# Patient Record
Sex: Male | Born: 1940 | Race: Black or African American | Hispanic: No | Marital: Single | Smoking: Former smoker
Health system: Southern US, Community
[De-identification: ages and names within clinical notes are randomized; demographics above are authoritative.]

## PROBLEM LIST (undated history)

## (undated) DIAGNOSIS — C801 Malignant (primary) neoplasm, unspecified: Secondary | ICD-10-CM

## (undated) DIAGNOSIS — J329 Chronic sinusitis, unspecified: Secondary | ICD-10-CM

## (undated) DIAGNOSIS — K219 Gastro-esophageal reflux disease without esophagitis: Secondary | ICD-10-CM

## (undated) DIAGNOSIS — M549 Dorsalgia, unspecified: Secondary | ICD-10-CM

## (undated) DIAGNOSIS — IMO0001 Reserved for inherently not codable concepts without codable children: Secondary | ICD-10-CM

## (undated) DIAGNOSIS — F419 Anxiety disorder, unspecified: Secondary | ICD-10-CM

## (undated) DIAGNOSIS — I2510 Atherosclerotic heart disease of native coronary artery without angina pectoris: Secondary | ICD-10-CM

## (undated) DIAGNOSIS — E7800 Pure hypercholesterolemia, unspecified: Secondary | ICD-10-CM

## (undated) DIAGNOSIS — C61 Malignant neoplasm of prostate: Secondary | ICD-10-CM

## (undated) DIAGNOSIS — I10 Essential (primary) hypertension: Secondary | ICD-10-CM

## (undated) HISTORY — PX: BACK SURGERY: SHX140

## (undated) HISTORY — PX: KNEE ARTHROSCOPY: SUR90

---

## 2012-09-08 ENCOUNTER — Encounter (HOSPITAL_BASED_OUTPATIENT_CLINIC_OR_DEPARTMENT_OTHER): Payer: Self-pay | Admitting: *Deleted

## 2012-09-08 ENCOUNTER — Emergency Department (HOSPITAL_BASED_OUTPATIENT_CLINIC_OR_DEPARTMENT_OTHER): Payer: Medicare HMO

## 2012-09-08 ENCOUNTER — Emergency Department (HOSPITAL_BASED_OUTPATIENT_CLINIC_OR_DEPARTMENT_OTHER)
Admission: EM | Admit: 2012-09-08 | Discharge: 2012-09-08 | Disposition: A | Discharge status: Home / Self Care | Payer: Medicare HMO | Attending: Emergency Medicine | Admitting: Emergency Medicine

## 2012-09-08 DIAGNOSIS — M549 Dorsalgia, unspecified: Secondary | ICD-10-CM | POA: Insufficient documentation

## 2012-09-08 DIAGNOSIS — Z7982 Long term (current) use of aspirin: Secondary | ICD-10-CM | POA: Insufficient documentation

## 2012-09-08 DIAGNOSIS — J329 Chronic sinusitis, unspecified: Secondary | ICD-10-CM | POA: Insufficient documentation

## 2012-09-08 DIAGNOSIS — Z87891 Personal history of nicotine dependence: Secondary | ICD-10-CM | POA: Insufficient documentation

## 2012-09-08 DIAGNOSIS — E7800 Pure hypercholesterolemia, unspecified: Secondary | ICD-10-CM | POA: Insufficient documentation

## 2012-09-08 DIAGNOSIS — Z791 Long term (current) use of non-steroidal anti-inflammatories (NSAID): Secondary | ICD-10-CM | POA: Insufficient documentation

## 2012-09-08 DIAGNOSIS — I10 Essential (primary) hypertension: Secondary | ICD-10-CM | POA: Insufficient documentation

## 2012-09-08 DIAGNOSIS — R51 Headache: Secondary | ICD-10-CM | POA: Insufficient documentation

## 2012-09-08 DIAGNOSIS — I2510 Atherosclerotic heart disease of native coronary artery without angina pectoris: Secondary | ICD-10-CM | POA: Insufficient documentation

## 2012-09-08 DIAGNOSIS — K219 Gastro-esophageal reflux disease without esophagitis: Secondary | ICD-10-CM | POA: Insufficient documentation

## 2012-09-08 HISTORY — DX: Pure hypercholesterolemia, unspecified: E78.00

## 2012-09-08 HISTORY — DX: Chronic sinusitis, unspecified: J32.9

## 2012-09-08 HISTORY — DX: Atherosclerotic heart disease of native coronary artery without angina pectoris: I25.10

## 2012-09-08 HISTORY — DX: Essential (primary) hypertension: I10

## 2012-09-08 HISTORY — DX: Gastro-esophageal reflux disease without esophagitis: K21.9

## 2012-09-08 HISTORY — DX: Reserved for inherently not codable concepts without codable children: IMO0001

## 2012-09-08 LAB — CBC WITH DIFFERENTIAL/PLATELET
Basophils Absolute: 0 10*3/uL (ref 0.0–0.1)
Basophils Relative: 0 % (ref 0–1)
Eosinophils Absolute: 0 10*3/uL (ref 0.0–0.7)
Hemoglobin: 12.7 g/dL — ABNORMAL LOW (ref 13.0–17.0)
MCH: 29.5 pg (ref 26.0–34.0)
MCHC: 33.4 g/dL (ref 30.0–36.0)
Monocytes Absolute: 0.3 10*3/uL (ref 0.1–1.0)
Monocytes Relative: 7 % (ref 3–12)
Neutrophils Relative %: 75 % (ref 43–77)
RDW: 12.8 % (ref 11.5–15.5)

## 2012-09-08 LAB — URINALYSIS, ROUTINE W REFLEX MICROSCOPIC
Glucose, UA: NEGATIVE mg/dL
Leukocytes, UA: NEGATIVE
Specific Gravity, Urine: 1.02 (ref 1.005–1.030)
pH: 7 (ref 5.0–8.0)

## 2012-09-08 LAB — COMPREHENSIVE METABOLIC PANEL
Albumin: 3.8 g/dL (ref 3.5–5.2)
BUN: 17 mg/dL (ref 6–23)
Creatinine, Ser: 1 mg/dL (ref 0.50–1.35)
Total Protein: 7.2 g/dL (ref 6.0–8.3)

## 2012-09-08 MED ORDER — PREDNISONE 10 MG PO TABS: 20.0000 mg | ORAL_TABLET | Freq: Two times a day (BID) | ORAL | Status: AC

## 2012-09-08 MED ORDER — HYDROCODONE-ACETAMINOPHEN 5-325 MG PO TABS: 2.0000 | ORAL_TABLET | ORAL | Status: AC | PRN

## 2012-09-08 MED ORDER — HYDROCODONE-ACETAMINOPHEN 5-325 MG PO TABS
2.0000 | ORAL_TABLET | Freq: Once | ORAL | Status: AC
  Administered 2012-09-08: 2 via ORAL
  Filled 2012-09-08: qty 2

## 2012-09-08 NOTE — ED Notes (Signed)
Patient states he has a multiple year history of pain in his left groin, radiating down the left leg to the foot, and into his left back up his spine into his head.  States this morning the pain intensified causing problems with dressing.  Pt is s/p lumbar spine surgery with pain problems since surgery.  This morning, the pain in his groin is worse.

## 2012-09-08 NOTE — ED Provider Notes (Signed)
History     CSN: [id number]  Arrival date & time 09/08/12  6295   First MD Initiated Contact with Patient 09/08/12 [phone]      Chief Complaint  Patient presents with  . Muscle Pain    (Consider location/radiation/quality/duration/timing/severity/associated sxs/prior treatment) HPI Comments: Patient presents with multiple complaints.  Patient has pcp in [place].  He recently moved here to live with daughter and son-in-law.  He has a history of chronic low back pain since having surgery 40 years ago.  This suddenly became worse yesterday with radiation to the left groin and neck.  It is worse when he moves or stands.  No weakness or numbness in the feet or legs.  No bowel or bladder complaints.    He is also complaining of "fogginess" in his head, occasionally seems confused.  This has been going on for some time now.  Daughter had him seen by pcp yesterday and passed a mental status exam.  She also reports that he had a colonoscopy years ago and had polyps removed.  He was to have more testing but the patient declined to have any more done.    The history is provided by the patient.    Past Medical History  Diagnosis Date  . Hypertension   . Sinusitis, chronic   . Coronary artery disease   . Reflux   . Elevated cholesterol     Past Surgical History  Procedure Laterality Date  . Back surgery    . Knee arthroscopy Left     No family history on file.  History  Substance Use Topics  . Smoking status: Former Games developer  . Smokeless tobacco: Not on file  . Alcohol Use: No      Review of Systems  All other systems reviewed and are negative.    Allergies  Review of patient's allergies indicates no known allergies.  Home Medications   Current Outpatient Rx  Name  Route  Sig  Dispense  Refill  . ALPRAZolam (XANAX) 0.5 MG tablet   Oral   Take 0.5 mg by mouth at bedtime as needed for sleep.         Marland Kitchen amLODipine (NORVASC) 10 MG tablet   Oral   Take 10 mg by mouth  daily.         Marland Kitchen aspirin 81 MG tablet   Oral   Take 81 mg by mouth daily.         . carvedilol (COREG) 25 MG tablet   Oral   Take by mouth 2 (two) times daily with a meal.         . cloNIDine (CATAPRES) 0.3 MG tablet   Oral   Take 0.3 mg by mouth 2 (two) times daily.         . ferrous gluconate (FERGON) 324 MG tablet   Oral   Take 324 mg by mouth daily with breakfast.         . fexofenadine (ALLEGRA) 180 MG tablet   Oral   Take 180 mg by mouth daily.         . folic acid (FOLVITE) 1 MG tablet   Oral   Take by mouth daily.         . furosemide (LASIX) 40 MG tablet   Oral   Take 40 mg by mouth daily.         . hydrALAZINE (APRESOLINE) 100 MG tablet   Oral   Take 100 mg by mouth 2 (two) times daily.         Marland Kitchen  isosorbide dinitrate (ISORDIL) 30 MG tablet   Oral   Take 30 mg by mouth 4 (four) times daily.         Marland Kitchen omeprazole (PRILOSEC) 20 MG capsule   Oral   Take 20 mg by mouth daily.         . potassium chloride (K-DUR) 10 MEQ tablet   Oral   Take 10 mEq by mouth 2 (two) times daily.         . rosuvastatin (CRESTOR) 10 MG tablet   Oral   Take 10 mg by mouth daily.         . Vitamin D, Ergocalciferol, (DRISDOL) 50000 UNITS CAPS   Oral   Take 50,000 Units by mouth.           BP 199/88  Pulse 122  Temp(Src) 98.1 F (36.7 C) (Oral)  Resp 20  Ht 5' 10.5" (1.791 m)  Wt 224 lb (101.606 kg)  BMI 31.68 kg/m2  SpO2 96%  Physical Exam  Nursing note and vitals reviewed. Constitutional: He is oriented to person, place, and time. He appears well-developed and well-nourished. No distress.  HENT:  Head: Normocephalic and atraumatic.  Mouth/Throat: Oropharynx is clear and moist. No oropharyngeal exudate.  Eyes: EOM are normal. Pupils are equal, round, and reactive to light.  Neck: Normal range of motion. Neck supple.  Cardiovascular: Normal rate, regular rhythm and normal heart sounds.   No murmur heard. Pulmonary/Chest: Effort normal  and breath sounds normal. No respiratory distress. He has no wheezes.  Abdominal: Soft. Bowel sounds are normal. He exhibits no distension. There is no tenderness.  Musculoskeletal: Normal range of motion.  The ble are noted to have findings consistent with venous stasis, however DP and PT pulses are easily palpable.    Lymphadenopathy:    He has no cervical adenopathy.  Neurological: He is alert and oriented to person, place, and time. No cranial nerve deficit. He exhibits normal muscle tone. Coordination normal.  DTR's are trace and equal in the ble.  Strength is 5/5 in the ble.    Skin: Skin is warm and dry. He is not diaphoretic.    ED Course  Procedures (including critical care time)  Labs Reviewed - No data to display No results found.   No diagnosis found.    MDM  The workup reveals unremarkable labs, urine, and xrays.  He was also complaining of his head hurting and feeling hollow.  The ct was unremarkable.  I will prescribe prednisone and pain medication for his back pain that appears radicular in nature.          Geoffery Lyons, MD 09/08/12 [phone]

## 2014-05-18 ENCOUNTER — Encounter (HOSPITAL_BASED_OUTPATIENT_CLINIC_OR_DEPARTMENT_OTHER): Payer: Self-pay | Admitting: *Deleted

## 2014-05-18 ENCOUNTER — Emergency Department (HOSPITAL_BASED_OUTPATIENT_CLINIC_OR_DEPARTMENT_OTHER): Payer: Medicare HMO

## 2014-05-18 ENCOUNTER — Emergency Department (HOSPITAL_BASED_OUTPATIENT_CLINIC_OR_DEPARTMENT_OTHER)
Admission: EM | Admit: 2014-05-18 | Discharge: 2014-05-18 | Disposition: A | Discharge status: Home / Self Care | Payer: Medicare HMO | Attending: Emergency Medicine | Admitting: Emergency Medicine

## 2014-05-18 DIAGNOSIS — K219 Gastro-esophageal reflux disease without esophagitis: Secondary | ICD-10-CM | POA: Diagnosis not present

## 2014-05-18 DIAGNOSIS — S39012A Strain of muscle, fascia and tendon of lower back, initial encounter: Secondary | ICD-10-CM | POA: Insufficient documentation

## 2014-05-18 DIAGNOSIS — R109 Unspecified abdominal pain: Secondary | ICD-10-CM | POA: Insufficient documentation

## 2014-05-18 DIAGNOSIS — I10 Essential (primary) hypertension: Secondary | ICD-10-CM | POA: Insufficient documentation

## 2014-05-18 DIAGNOSIS — E780 Pure hypercholesterolemia: Secondary | ICD-10-CM | POA: Diagnosis not present

## 2014-05-18 DIAGNOSIS — I2510 Atherosclerotic heart disease of native coronary artery without angina pectoris: Secondary | ICD-10-CM | POA: Insufficient documentation

## 2014-05-18 DIAGNOSIS — Z7982 Long term (current) use of aspirin: Secondary | ICD-10-CM | POA: Diagnosis not present

## 2014-05-18 DIAGNOSIS — Y9289 Other specified places as the place of occurrence of the external cause: Secondary | ICD-10-CM | POA: Diagnosis not present

## 2014-05-18 DIAGNOSIS — M5416 Radiculopathy, lumbar region: Secondary | ICD-10-CM | POA: Insufficient documentation

## 2014-05-18 DIAGNOSIS — F419 Anxiety disorder, unspecified: Secondary | ICD-10-CM | POA: Diagnosis not present

## 2014-05-18 DIAGNOSIS — Y9389 Activity, other specified: Secondary | ICD-10-CM | POA: Insufficient documentation

## 2014-05-18 DIAGNOSIS — Y998 Other external cause status: Secondary | ICD-10-CM | POA: Insufficient documentation

## 2014-05-18 DIAGNOSIS — Z87891 Personal history of nicotine dependence: Secondary | ICD-10-CM | POA: Diagnosis not present

## 2014-05-18 DIAGNOSIS — Z79899 Other long term (current) drug therapy: Secondary | ICD-10-CM | POA: Diagnosis not present

## 2014-05-18 DIAGNOSIS — M5410 Radiculopathy, site unspecified: Secondary | ICD-10-CM

## 2014-05-18 DIAGNOSIS — X58XXXA Exposure to other specified factors, initial encounter: Secondary | ICD-10-CM | POA: Insufficient documentation

## 2014-05-18 DIAGNOSIS — M545 Low back pain: Secondary | ICD-10-CM | POA: Diagnosis present

## 2014-05-18 HISTORY — DX: Anxiety disorder, unspecified: F41.9

## 2014-05-18 LAB — URINALYSIS, ROUTINE W REFLEX MICROSCOPIC
BILIRUBIN URINE: NEGATIVE
GLUCOSE, UA: NEGATIVE mg/dL
HGB URINE DIPSTICK: NEGATIVE
Ketones, ur: NEGATIVE mg/dL
LEUKOCYTES UA: NEGATIVE
Nitrite: NEGATIVE
PH: 7 (ref 5.0–8.0)
PROTEIN: NEGATIVE mg/dL
SPECIFIC GRAVITY, URINE: 1.008 (ref 1.005–1.030)
UROBILINOGEN UA: 1 mg/dL (ref 0.0–1.0)

## 2014-05-18 LAB — CBC WITH DIFFERENTIAL/PLATELET
BASOS PCT: 0 % (ref 0–1)
Basophils Absolute: 0 10*3/uL (ref 0.0–0.1)
Eosinophils Absolute: 0 10*3/uL (ref 0.0–0.7)
Eosinophils Relative: 1 % (ref 0–5)
HCT: 39.4 % (ref 39.0–52.0)
HEMOGLOBIN: 13 g/dL (ref 13.0–17.0)
Lymphocytes Relative: 21 % (ref 12–46)
Lymphs Abs: 1.1 10*3/uL (ref 0.7–4.0)
MCH: 29 pg (ref 26.0–34.0)
MCHC: 33 g/dL (ref 30.0–36.0)
MCV: 87.9 fL (ref 78.0–100.0)
MONOS PCT: 12 % (ref 3–12)
Monocytes Absolute: 0.6 10*3/uL (ref 0.1–1.0)
NEUTROS ABS: 3.5 10*3/uL (ref 1.7–7.7)
Neutrophils Relative %: 66 % (ref 43–77)
PLATELETS: 191 10*3/uL (ref 150–400)
RBC: 4.48 MIL/uL (ref 4.22–5.81)
RDW: 12.9 % (ref 11.5–15.5)
WBC: 5.2 10*3/uL (ref 4.0–10.5)

## 2014-05-18 LAB — COMPREHENSIVE METABOLIC PANEL
ALBUMIN: 4.3 g/dL (ref 3.5–5.2)
ALT: 22 U/L (ref 0–53)
ANION GAP: 5 (ref 5–15)
AST: 21 U/L (ref 0–37)
Alkaline Phosphatase: 46 U/L (ref 39–117)
BILIRUBIN TOTAL: 1 mg/dL (ref 0.3–1.2)
BUN: 22 mg/dL (ref 6–23)
CALCIUM: 9 mg/dL (ref 8.4–10.5)
CHLORIDE: 108 mmol/L (ref 96–112)
CO2: 25 mmol/L (ref 19–32)
Creatinine, Ser: 1.12 mg/dL (ref 0.50–1.35)
GFR, EST AFRICAN AMERICAN: 73 mL/min — AB (ref >90)
GFR, EST NON AFRICAN AMERICAN: 63 mL/min — AB (ref >90)
GLUCOSE: 121 mg/dL — AB (ref 70–99)
POTASSIUM: 3.8 mmol/L (ref 3.5–5.1)
Sodium: 138 mmol/L (ref 135–145)
TOTAL PROTEIN: 8.2 g/dL (ref 6.0–8.3)

## 2014-05-18 MED ORDER — CYCLOBENZAPRINE HCL 5 MG PO TABS: 5.0000 mg | ORAL_TABLET | Freq: Two times a day (BID) | ORAL | Status: AC | PRN

## 2014-05-18 MED ORDER — HYDROMORPHONE HCL 1 MG/ML IJ SOLN
1.0000 mg | Freq: Once | INTRAMUSCULAR | Status: AC
  Administered 2014-05-18: 1 mg via INTRAVENOUS
  Filled 2014-05-18: qty 1

## 2014-05-18 MED ORDER — IOHEXOL 300 MG/ML  SOLN
100.0000 mL | Freq: Once | INTRAMUSCULAR | Status: AC | PRN
  Administered 2014-05-18: 100 mL via INTRAVENOUS

## 2014-05-18 MED ORDER — IOHEXOL 300 MG/ML  SOLN
25.0000 mL | Freq: Once | INTRAMUSCULAR | Status: AC | PRN
  Administered 2014-05-18: 25 mL via ORAL

## 2014-05-18 MED ORDER — KETOROLAC TROMETHAMINE 30 MG/ML IJ SOLN
30.0000 mg | Freq: Once | INTRAMUSCULAR | Status: AC
  Administered 2014-05-18: 30 mg via INTRAVENOUS
  Filled 2014-05-18: qty 1

## 2014-05-18 NOTE — ED Provider Notes (Signed)
CSN: [id number]     Arrival date & time 05/18/14  0223 History   First MD Initiated Contact with Patient 05/18/14 [phone]     Chief Complaint  Patient presents with  . Back Pain     (Consider location/radiation/quality/duration/timing/severity/associated sxs/prior Treatment) Patient is a 74 y.o. male presenting with back pain.  Back Pain Location:  Lumbar spine (cervical spine) Quality:  Aching Radiates to:  Does not radiate Pain severity:  Moderate Onset quality:  Gradual Duration:  1 day Timing:  Constant Progression:  Worsening Chronicity:  Recurrent Context: not falling, not MVA and not recent injury   Context comment:  Prior discectomy 20 years prior Relieved by:  Nothing Worsened by:  Nothing tried Ineffective treatments:  None tried Associated symptoms: no abdominal pain, no fever and no numbness     Past Medical History  Diagnosis Date  . Hypertension   . Sinusitis, chronic   . Coronary artery disease   . Reflux   . Elevated cholesterol   . Anxiety    Past Surgical History  Procedure Laterality Date  . Back surgery    . Knee arthroscopy Left    No family history on file. History  Substance Use Topics  . Smoking status: Former Research scientist (life sciences)  . Smokeless tobacco: Not on file  . Alcohol Use: No    Review of Systems  Constitutional: Negative for fever.  Gastrointestinal: Negative for abdominal pain.  Musculoskeletal: Positive for back pain.  Neurological: Negative for numbness.  All other systems reviewed and are negative.     Allergies  Review of patient's allergies indicates no known allergies.  Home Medications   Prior to Admission medications   Medication Sig Start Date End Date Taking? Authorizing Provider  ALPRAZolam Duanne Moron) 0.5 MG tablet Take 0.5 mg by mouth at bedtime as needed for sleep.   Yes Historical Provider, MD  amLODipine (NORVASC) 10 MG tablet Take 10 mg by mouth daily.   Yes Historical Provider, MD  carvedilol (COREG) 25 MG tablet Take  by mouth 2 (two) times daily with a meal.   Yes Historical Provider, MD  cloNIDine (CATAPRES) 0.3 MG tablet Take 0.3 mg by mouth 2 (two) times daily.   Yes Historical Provider, MD  furosemide (LASIX) 40 MG tablet Take 40 mg by mouth daily.   Yes Historical Provider, MD  hydrALAZINE (APRESOLINE) 100 MG tablet Take 100 mg by mouth 2 (two) times daily.   Yes Historical Provider, MD  isosorbide dinitrate (ISORDIL) 30 MG tablet Take 30 mg by mouth 4 (four) times daily.   Yes Historical Provider, MD  omeprazole (PRILOSEC) 20 MG capsule Take 20 mg by mouth daily.   Yes Historical Provider, MD  potassium chloride (K-DUR) 10 MEQ tablet Take 10 mEq by mouth 2 (two) times daily.   Yes Historical Provider, MD  rosuvastatin (CRESTOR) 10 MG tablet Take 10 mg by mouth daily.   Yes Historical Provider, MD  aspirin 81 MG tablet Take 81 mg by mouth daily.    Historical Provider, MD  cyclobenzaprine (FLEXERIL) 5 MG tablet Take 1 tablet (5 mg total) by mouth 2 (two) times daily as needed for muscle spasms. 05/18/14   Debby Freiberg, MD  ferrous gluconate (FERGON) 324 MG tablet Take 324 mg by mouth daily with breakfast.    Historical Provider, MD  fexofenadine (ALLEGRA) 180 MG tablet Take 180 mg by mouth daily.    Historical Provider, MD  folic acid (FOLVITE) 1 MG tablet Take by mouth daily.  Historical Provider, MD  HYDROcodone-acetaminophen (NORCO) 5-325 MG per tablet Take 2 tablets by mouth every 4 (four) hours as needed for pain. 09/08/12   Veryl Speak, MD  predniSONE (DELTASONE) 10 MG tablet Take 2 tablets (20 mg total) by mouth 2 (two) times daily. 09/08/12   Veryl Speak, MD  Vitamin D, Ergocalciferol, (DRISDOL) 50000 UNITS CAPS Take 50,000 Units by mouth.    Historical Provider, MD   BP 158/87 mmHg  Pulse 68  Temp(Src) 98 F (36.7 C) (Oral)  Resp 18  Ht 5\' 10"  (1.778 m)  Wt 219 lb (99.338 kg)  BMI 31.42 kg/m2  SpO2 98% Physical Exam  Constitutional: He is oriented to person, place, and time. He appears  well-developed and well-nourished.  HENT:  Head: Normocephalic and atraumatic.  Eyes: Conjunctivae and EOM are normal.  Neck: Normal range of motion. Neck supple.  Cardiovascular: Normal rate, regular rhythm and normal heart sounds.   Pulmonary/Chest: Effort normal and breath sounds normal. No respiratory distress.  Abdominal: He exhibits no distension. There is no tenderness. There is no rebound and no guarding.  Musculoskeletal: Normal range of motion.       Cervical back: He exhibits tenderness. He exhibits no bony tenderness.       Thoracic back: Normal.       Lumbar back: He exhibits tenderness and bony tenderness.  Straight leg raise positive  Neurological: He is alert and oriented to person, place, and time. He has normal strength. No sensory deficit. Gait normal.  Skin: Skin is warm and dry.  Vitals reviewed.   ED Course  Procedures (including critical care time) Labs Review Labs Reviewed  COMPREHENSIVE METABOLIC PANEL - Abnormal; Notable for the following:    Glucose, Bld 121 (*)    GFR calc non Af Amer 63 (*)    GFR calc Af Amer 73 (*)    All other components within normal limits  URINE CULTURE  URINALYSIS, ROUTINE W REFLEX MICROSCOPIC  CBC WITH DIFFERENTIAL/PLATELET    Imaging Review Ct Abdomen Pelvis W Contrast  05/18/2014   CLINICAL DATA:  Left flank pain.  Low back and left leg pain.  EXAM: CT ABDOMEN AND PELVIS WITH CONTRAST  TECHNIQUE: Multidetector CT imaging of the abdomen and pelvis was performed using the standard protocol following bolus administration of intravenous contrast.  CONTRAST:  31mL OMNIPAQUE IOHEXOL 300 MG/ML SOLN, 142mL OMNIPAQUE IOHEXOL 300 MG/ML SOLN  COMPARISON:  12/05/2010  FINDINGS: The included lung bases are clear.  There is a small hiatal hernia.  There are no focal hepatic lesions. The gallbladder is physiologically distended. The spleen, pancreas, and adrenal glands are normal. Kidneys demonstrate symmetric enhancement and excretion. There  is no hydronephrosis or perinephric stranding. There are no dilated or thickened bowel loops. Mild diverticulosis of the descending colon without diverticulitis. The appendix is tentatively identified and normal.  The abdominal aorta is normal in caliber with moderate atherosclerosis. No aneurysmal dilatation. No enlarged retroperitoneal lymph nodes. No free air, free fluid, or intra-abdominal fluid collection.  Bladder is physiologically distended. Prostate gland appears mildly enlarged. No pathologic pelvic adenopathy. No pelvic free fluid.  Advanced multilevel degenerative change in the lumbar spine with degenerative disc disease and facet arthropathy. Probable postsurgical change at L4-L5. Advanced left greater than right hip degenerative change. No lytic or blastic osseous lesions.  IMPRESSION: 1. No acute abnormality in the abdomen/pelvis. 2. Incidental findings of diverticulosis, no diverticulitis. Mildly enlarged prostate gland. 3. Advanced multilevel degenerative change throughout the lumbar spine. Advanced  left greater than right hip degenerative change.   Electronically Signed   By: Jeb Levering M.D.   On: 05/18/2014 06:24     EKG Interpretation None      MDM   Final diagnoses:  Left flank pain  Lumbar strain, initial encounter  Radicular pain    74 y.o. male with pertinent PMH of Prior discectomy, chronic back pain, HTN, CAD presents with recurrent acute on chronic back pain.  No historical elements of cauda equina.  No abd tenderness.  Pain exacerbated by movement, palpation.  No urinary symptoms.  Wu as above unremarkable for acute pathology.  UA negative.  CT scan with normal aorta, but with degenerative disc dz.  Likely lumbar strain, sciatica.  DC home with standard return precautions.   I have reviewed all laboratory and imaging studies if ordered as above  1. Lumbar strain, initial encounter   2. Left flank pain   3. Radicular pain         Debby Freiberg,  MD 05/18/14 [phone]

## 2014-05-18 NOTE — ED Notes (Signed)
C/o back, neck, left leg and rt side pain onset Sunday  Denies inj

## 2014-05-18 NOTE — ED Notes (Signed)
C/o middle back , left leg, neck and rt side pain onset Sunday  Denies inj

## 2014-05-18 NOTE — Discharge Instructions (Signed)
Back Pain, Adult Low back pain is very common. About 1 in 5 people have back pain.The cause of low back pain is rarely dangerous. The pain often gets better over time.About half of people with a sudden onset of back pain feel better in just 2 weeks. About 8 in 10 people feel better by 6 weeks.  CAUSES Some common causes of back pain include:  Strain of the muscles or ligaments supporting the spine.  Wear and tear (degeneration) of the spinal discs.  Arthritis.  Direct injury to the back. DIAGNOSIS Most of the time, the direct cause of low back pain is not known.However, back pain can be treated effectively even when the exact cause of the pain is unknown.Answering your caregiver's questions about your overall health and symptoms is one of the most accurate ways to make sure the cause of your pain is not dangerous. If your caregiver needs more information, he or she may order lab work or imaging tests (X-rays or MRIs).However, even if imaging tests show changes in your back, this usually does not require surgery. HOME CARE INSTRUCTIONS For many people, back pain returns.Since low back pain is rarely dangerous, it is often a condition that people can learn to manageon their own.   Remain active. It is stressful on the back to sit or stand in one place. Do not sit, drive, or stand in one place for more than 30 minutes at a time. Take short walks on level surfaces as soon as pain allows.Try to increase the length of time you walk each day.  Do not stay in bed.Resting more than 1 or 2 days can delay your recovery.  Do not avoid exercise or work.Your body is made to move.It is not dangerous to be active, even though your back may hurt.Your back will likely heal faster if you return to being active before your pain is gone.  Pay attention to your body when you bend and lift. Many people have less discomfortwhen lifting if they bend their knees, keep the load close to their bodies,and  avoid twisting. Often, the most comfortable positions are those that put less stress on your recovering back.  Find a comfortable position to sleep. Use a firm mattress and lie on your side with your knees slightly bent. If you lie on your back, put a pillow under your knees.  Only take over-the-counter or prescription medicines as directed by your caregiver. Over-the-counter medicines to reduce pain and inflammation are often the most helpful.Your caregiver may prescribe muscle relaxant drugs.These medicines help dull your pain so you can more quickly return to your normal activities and healthy exercise.  Put ice on the injured area.  Put ice in a plastic bag.  Place a towel between your skin and the bag.  Leave the ice on for 15-20 minutes, 03-04 times a day for the first 2 to 3 days. After that, ice and heat may be alternated to reduce pain and spasms.  Ask your caregiver about trying back exercises and gentle massage. This may be of some benefit.  Avoid feeling anxious or stressed.Stress increases muscle tension and can worsen back pain.It is important to recognize when you are anxious or stressed and learn ways to manage it.Exercise is a great option. SEEK MEDICAL CARE IF:  You have pain that is not relieved with rest or medicine.  You have pain that does not improve in 1 week.  You have new symptoms.  You are generally not feeling well. SEEK   IMMEDIATE MEDICAL CARE IF:   You have pain that radiates from your back into your legs.  You develop new bowel or bladder control problems.  You have unusual weakness or numbness in your arms or legs.  You develop nausea or vomiting.  You develop abdominal pain.  You feel faint. Document Released: 04/02/2005 Document Revised: 10/02/2011 Document Reviewed: 08/04/2013 Exit[employer] Patient Information 2015 [employer]. This information is not intended to replace advice given to you by your health care provider. Make sure you  discuss any questions you have with your health care provider.  

## 2014-05-19 LAB — URINE CULTURE
COLONY COUNT: NO GROWTH
Culture: NO GROWTH

## 2015-09-30 ENCOUNTER — Emergency Department (HOSPITAL_BASED_OUTPATIENT_CLINIC_OR_DEPARTMENT_OTHER)
Admission: EM | Admit: 2015-09-30 | Discharge: 2015-10-01 | Disposition: A | Discharge status: Home / Self Care | Payer: Medicare HMO | Attending: Emergency Medicine | Admitting: Emergency Medicine

## 2015-09-30 ENCOUNTER — Encounter (HOSPITAL_BASED_OUTPATIENT_CLINIC_OR_DEPARTMENT_OTHER): Payer: Self-pay | Admitting: *Deleted

## 2015-09-30 ENCOUNTER — Emergency Department (HOSPITAL_BASED_OUTPATIENT_CLINIC_OR_DEPARTMENT_OTHER): Payer: Medicare HMO

## 2015-09-30 DIAGNOSIS — I10 Essential (primary) hypertension: Secondary | ICD-10-CM | POA: Insufficient documentation

## 2015-09-30 DIAGNOSIS — Z7982 Long term (current) use of aspirin: Secondary | ICD-10-CM | POA: Insufficient documentation

## 2015-09-30 DIAGNOSIS — Z859 Personal history of malignant neoplasm, unspecified: Secondary | ICD-10-CM | POA: Insufficient documentation

## 2015-09-30 DIAGNOSIS — M79605 Pain in left leg: Secondary | ICD-10-CM

## 2015-09-30 DIAGNOSIS — Z79899 Other long term (current) drug therapy: Secondary | ICD-10-CM | POA: Insufficient documentation

## 2015-09-30 DIAGNOSIS — M79662 Pain in left lower leg: Secondary | ICD-10-CM | POA: Insufficient documentation

## 2015-09-30 DIAGNOSIS — I2510 Atherosclerotic heart disease of native coronary artery without angina pectoris: Secondary | ICD-10-CM | POA: Insufficient documentation

## 2015-09-30 DIAGNOSIS — Z8546 Personal history of malignant neoplasm of prostate: Secondary | ICD-10-CM | POA: Diagnosis not present

## 2015-09-30 DIAGNOSIS — Z87891 Personal history of nicotine dependence: Secondary | ICD-10-CM | POA: Diagnosis not present

## 2015-09-30 HISTORY — DX: Malignant neoplasm of prostate: C61

## 2015-09-30 HISTORY — DX: Malignant (primary) neoplasm, unspecified: C80.1

## 2015-09-30 LAB — CBC
HEMATOCRIT: 33.9 % — AB (ref 39.0–52.0)
Hemoglobin: 11.4 g/dL — ABNORMAL LOW (ref 13.0–17.0)
MCH: 28.7 pg (ref 26.0–34.0)
MCHC: 33.6 g/dL (ref 30.0–36.0)
MCV: 85.4 fL (ref 78.0–100.0)
PLATELETS: 175 10*3/uL (ref 150–400)
RBC: 3.97 MIL/uL — AB (ref 4.22–5.81)
RDW: 12.8 % (ref 11.5–15.5)
WBC: 3.9 10*3/uL — AB (ref 4.0–10.5)

## 2015-09-30 LAB — BASIC METABOLIC PANEL
Anion gap: 6 (ref 5–15)
BUN: 13 mg/dL (ref 6–20)
CALCIUM: 9.2 mg/dL (ref 8.9–10.3)
CO2: 28 mmol/L (ref 22–32)
CREATININE: 1.03 mg/dL (ref 0.61–1.24)
Chloride: 105 mmol/L (ref 101–111)
GFR calc non Af Amer: >60 mL/min (ref >60)
GLUCOSE: 103 mg/dL — AB (ref 65–99)
Potassium: 3.1 mmol/L — ABNORMAL LOW (ref 3.5–5.1)
Sodium: 139 mmol/L (ref 135–145)

## 2015-09-30 MED ORDER — HYDROCODONE-ACETAMINOPHEN 5-325 MG PO TABS: 1.0000 | ORAL_TABLET | ORAL | Status: AC | PRN

## 2015-09-30 MED ORDER — DOCUSATE SODIUM 100 MG PO CAPS: 100.0000 mg | ORAL_CAPSULE | Freq: Two times a day (BID) | ORAL | Status: AC

## 2015-09-30 MED ORDER — POTASSIUM CHLORIDE 20 MEQ/15ML (10%) PO SOLN
40.0000 meq | Freq: Once | ORAL | Status: AC
  Administered 2015-09-30: 40 meq via ORAL
  Filled 2015-09-30: qty 30

## 2015-09-30 NOTE — ED Notes (Signed)
C/o pain to outside of lower left leg off and on x 2 months,  Worse since last pm,  No redness or swelling noted

## 2015-09-30 NOTE — Discharge Instructions (Signed)
Take norco for severe pain. Colace to prevent constipation while taking this pain medication. Your blood work showed no signs of infection. Your ultrasound showed no blood clot. We suspect your pain is due to arthritis or even a nerve pain down your leg. Please follow up with your doctor for recheck.    Radicular Pain Radicular pain in either the arm or leg is usually from a bulging or herniated disk in the spine. A piece of the herniated disk may press against the nerves as the nerves exit the spine. This causes pain which is felt at the tips of the nerves down the arm or leg. Other causes of radicular pain may include:  Fractures.  Heart disease.  Cancer.  An abnormal and usually degenerative state of the nervous system or nerves (neuropathy). Diagnosis may require CT or MRI scanning to determine the primary cause.  Nerves that start at the neck (nerve roots) may cause radicular pain in the outer shoulder and arm. It can spread down to the thumb and fingers. The symptoms vary depending on which nerve root has been affected. In most cases radicular pain improves with conservative treatment. Neck problems may require physical therapy, a neck collar, or cervical traction. Treatment may take many weeks, and surgery may be considered if the symptoms do not improve.  Conservative treatment is also recommended for sciatica. Sciatica causes pain to radiate from the lower back or buttock area down the leg into the foot. Often there is a history of back problems. Most patients with sciatica are better after 2 to 4 weeks of rest and other supportive care. Short term bed rest can reduce the disk pressure considerably. Sitting, however, is not a good position since this increases the pressure on the disk. You should avoid bending, lifting, and all other activities which make the problem worse. Traction can be used in severe cases. Surgery is usually reserved for patients who do not improve within the first  months of treatment. Only take over-the-counter or prescription medicines for pain, discomfort, or fever as directed by your caregiver. Narcotics and muscle relaxants may help by relieving more severe pain and spasm and by providing mild sedation. Cold or massage can give significant relief. Spinal manipulation is not recommended. It can increase the degree of disc protrusion. Epidural steroid injections are often effective treatment for radicular pain. These injections deliver medicine to the spinal nerve in the space between the protective covering of the spinal cord and back bones (vertebrae). Your caregiver can give you more information about steroid injections. These injections are most effective when given within two weeks of the onset of pain.  You should see your caregiver for follow up care as recommended. A program for neck and back injury rehabilitation with stretching and strengthening exercises is an important part of management.  SEEK IMMEDIATE MEDICAL CARE IF:  You develop increased pain, weakness, or numbness in your arm or leg.  You develop difficulty with bladder or bowel control.  You develop abdominal pain.   This information is not intended to replace advice given to you by your health care provider. Make sure you discuss any questions you have with your health care provider.   Document Released: 05/10/2004 Document Revised: 04/23/2014 Document Reviewed: 10/27/2014 Elsevier Interactive Patient Education [employer].

## 2015-09-30 NOTE — ED Notes (Signed)
Left lower leg pain since last night. He was diagnosed with cellulitis a month ago.

## 2015-09-30 NOTE — ED Provider Notes (Signed)
CSN: [id number]     Arrival date & time 09/30/15  1922 History   By signing my name below, I, Jasmyn B. Alexander, attest that this documentation has been prepared under the direction and in the presence of Brylei Pedley, PA-C.  Electronically Signed: Tedra Coupe. Sheppard Coil, ED Scribe. 09/30/2015. 9:08 PM.   Chief Complaint  Patient presents with  . Leg Pain   The history is provided by the patient and a relative. No language interpreter was used.   HPI Comments: Justin Ellison is a 75 y.o. male with PMHx of HTN, CAD, and HLD who presents to the Emergency Department complaining of gradual onset, constant, worsening, left lower leg pain radiating to left foot x 1 day. Pt has had intermittent leg pain for about 2 months but has worsened since last night which caused him to come to [facility] ED. Per pt's son, he was recently diagnosed with cellulitis approximately a month ago by his PCP Dr. Nevada Crane. Pt has been on antibiotics for diagnosis. He has taken Extra Strength Tylenol with no relief. Pt states pain is exacerbated upon pressure and while walking. He has not fallen recently. He denies any redness or swelling of extremity.   Past Medical History  Diagnosis Date  . Hypertension   . Sinusitis, chronic   . Coronary artery disease   . Reflux   . Elevated cholesterol   . Anxiety   . Cancer ([place])   . Prostate cancer [facility])    Past Surgical History  Procedure Laterality Date  . Back surgery    . Knee arthroscopy Left    No family history on file. Social History  Substance Use Topics  . Smoking status: Former Research scientist (life sciences)  . Smokeless tobacco: None  . Alcohol Use: No    Review of Systems  Constitutional: Negative for fever.  Musculoskeletal: Positive for myalgias and arthralgias. Negative for joint swelling.  Skin: Negative for color change.  All other systems reviewed and are negative.   Allergies  Review of patient's allergies indicates no known allergies.  Home Medications   Prior to  Admission medications   Medication Sig Start Date End Date Taking? Authorizing Provider  ALPRAZolam Duanne Moron) 0.5 MG tablet Take 0.5 mg by mouth at bedtime as needed for sleep.   Yes Historical Provider, MD  amLODipine (NORVASC) 10 MG tablet Take 10 mg by mouth daily.   Yes Historical Provider, MD  aspirin 81 MG tablet Take 81 mg by mouth daily.   Yes Historical Provider, MD  bicalutamide (CASODEX) 50 MG tablet Take 50 mg by mouth daily.   Yes Historical Provider, MD  carvedilol (COREG) 25 MG tablet Take by mouth 2 (two) times daily with a meal.   Yes Historical Provider, MD  cloNIDine (CATAPRES) 0.3 MG tablet Take 0.3 mg by mouth 2 (two) times daily.   Yes Historical Provider, MD  finasteride (PROSCAR) 5 MG tablet Take 5 mg by mouth daily.   Yes Historical Provider, MD  furosemide (LASIX) 40 MG tablet Take 40 mg by mouth daily.   Yes Historical Provider, MD  hydrALAZINE (APRESOLINE) 100 MG tablet Take 100 mg by mouth 2 (two) times daily.   Yes Historical Provider, MD  isosorbide dinitrate (ISORDIL) 30 MG tablet Take 30 mg by mouth 4 (four) times daily.   Yes Historical Provider, MD  omeprazole (PRILOSEC) 20 MG capsule Take 20 mg by mouth daily.   Yes Historical Provider, MD  potassium chloride (K-DUR) 10 MEQ tablet Take 10 mEq by mouth 2 (  two) times daily.   Yes Historical Provider, MD  rosuvastatin (CRESTOR) 10 MG tablet Take 10 mg by mouth daily.   Yes Historical Provider, MD  cyclobenzaprine (FLEXERIL) 5 MG tablet Take 1 tablet (5 mg total) by mouth 2 (two) times daily as needed for muscle spasms. 05/18/14   Debby Freiberg, MD  ferrous gluconate (FERGON) 324 MG tablet Take 324 mg by mouth daily with breakfast.    Historical Provider, MD  fexofenadine (ALLEGRA) 180 MG tablet Take 180 mg by mouth daily.    Historical Provider, MD  folic acid (FOLVITE) 1 MG tablet Take by mouth daily.    Historical Provider, MD  HYDROcodone-acetaminophen (NORCO) 5-325 MG per tablet Take 2 tablets by mouth every 4  (four) hours as needed for pain. 09/08/12   Veryl Speak, MD  predniSONE (DELTASONE) 10 MG tablet Take 2 tablets (20 mg total) by mouth 2 (two) times daily. 09/08/12   Veryl Speak, MD  Vitamin D, Ergocalciferol, (DRISDOL) 50000 UNITS CAPS Take 50,000 Units by mouth.    Historical Provider, MD   BP 140/72 mmHg  Pulse 70  Temp(Src) 98.1 F (36.7 C) (Oral)  Resp 20  Ht 5\' 11"  (1.803 m)  Wt 230 lb (104.327 kg)  BMI 32.09 kg/m2  SpO2 98% Physical Exam  Constitutional: He is oriented to person, place, and time. He appears well-developed and well-nourished.  HENT:  Head: Normocephalic and atraumatic.  Eyes: EOM are normal. Pupils are equal, round, and reactive to light.  Neck: Normal range of motion.  Cardiovascular: Normal rate, regular rhythm and normal heart sounds.   Pulmonary/Chest: Effort normal and breath sounds normal. No respiratory distress. He has no wheezes. He has no rales.  Abdominal: Soft. Bowel sounds are normal. He exhibits no distension. There is no tenderness. There is no rebound and no guarding.  Musculoskeletal: Normal range of motion.  Hyperpigmentation and 1+ edema to bilateral LE. DP pulses intact and equal bilaterally. TTP over lateral shin. No rashes, no erythema, no warmth to the touch. Some pain with ROM of the knee and ankle.   Neurological: He is alert and oriented to person, place, and time.  Skin: Skin is warm and dry. No rash noted.  Psychiatric: He has a normal mood and affect. Judgment normal.  Nursing note and vitals reviewed.   ED Course  Procedures (including critical care time) DIAGNOSTIC STUDIES: Oxygen Saturation is 98% on RA, normal by my interpretation.    COORDINATION OF CARE: 9:08 PM-Discussed treatment plan which includes CBC panel, BMP, and [place] of LLE with pt at bedside and pt agreed to plan.   Labs Review Labs Reviewed  CBC - Abnormal; Notable for the following:    WBC 3.9 (*)    RBC 3.97 (*)    Hemoglobin 11.4 (*)    HCT 33.9 (*)     All other components within normal limits  BASIC METABOLIC PANEL - Abnormal; Notable for the following:    Potassium 3.1 (*)    Glucose, Bld 103 (*)    All other components within normal limits    Imaging Review US Venous Img Lower Unilateral Left  09/30/2015  CLINICAL DATA:  Left lower extremity pain and edema. EXAM: LEFT LOWER EXTREMITY VENOUS DOPPLER ULTRASOUND TECHNIQUE: Gray-scale sonography with graded compression, as well as color Doppler and duplex ultrasound were performed to evaluate the lower extremity deep venous systems from the level of the common femoral vein and including the common femoral, femoral, profunda femoral, popliteal and calf veins  including the posterior tibial, peroneal and gastrocnemius veins when visible. The superficial great saphenous vein was also interrogated. Spectral Doppler was utilized to evaluate flow at rest and with distal augmentation maneuvers in the common femoral, femoral and popliteal veins. COMPARISON:  None. FINDINGS: Contralateral Common Femoral Vein: Respiratory phasicity is normal and symmetric with the symptomatic side. No evidence of thrombus. Normal compressibility. Common Femoral Vein: No evidence of thrombus. Normal compressibility, respiratory phasicity and response to augmentation. Saphenofemoral Junction: No evidence of thrombus. Normal compressibility and flow on color Doppler imaging. Profunda Femoral Vein: No evidence of thrombus. Normal compressibility and flow on color Doppler imaging. Femoral Vein: No evidence of thrombus. Normal compressibility, respiratory phasicity and response to augmentation. Popliteal Vein: No evidence of thrombus. Normal compressibility, respiratory phasicity and response to augmentation. Calf Veins: No evidence of thrombus. Normal compressibility and flow on color Doppler imaging. Superficial Great Saphenous Vein: No evidence of thrombus. Normal compressibility and flow on color Doppler imaging. Venous Reflux:  None.  Other Findings:  Soft tissue edema of the calf is noted. IMPRESSION: No evidence of deep venous thrombosis. Electronically Signed   By: Fidela Salisbury M.D.   On: 09/30/2015 22:17   I have personally reviewed and evaluated these images and lab results as part of my medical decision-making.   MDM   Final diagnoses:  Leg pain, inferior, left   Patient with left lower leg pain on-and-off for several months. His son states that at some point his doctor told him this might be cellulitis but states he was never started on antibiotics. At this time no evidence of cellulitis. No acute injuries. DP pulses intact and equal bilaterally. Will get labs and venous Doppler to rule out DVT.   Venous Dopplers negative. No elevation in white blood cell count. Potassium slightly low, replenished with 40 mEq by mouth. Discussed with Dr. Vallery Ridge, who has seen patient as well. Suspect most likely musculoskeletal pain. Tendinitis versus even radicular pain. Will treat with Vicodin, follow-up with primary care doctor.  Filed Vitals:   09/30/15 1927 09/30/15 2208  BP: 140/72 135/70  Pulse: 70 58  Temp: 98.1 F (36.7 C)   TempSrc: Oral   Resp: 20 18  Height: 5\' 11"  (1.803 m)   Weight: 104.327 kg   SpO2: 98% 97%   I personally performed the services described in this documentation, which was scribed in my presence. The recorded information has been reviewed and is accurate.   Jeannett Senior, PA-C 10/01/15 [id number]  Charlesetta Shanks, MD 10/01/15 1452

## 2017-01-30 ENCOUNTER — Other Ambulatory Visit (HOSPITAL_BASED_OUTPATIENT_CLINIC_OR_DEPARTMENT_OTHER): Payer: Self-pay | Admitting: Internal Medicine

## 2017-01-30 ENCOUNTER — Ambulatory Visit (HOSPITAL_BASED_OUTPATIENT_CLINIC_OR_DEPARTMENT_OTHER)
Admission: RE | Admit: 2017-01-30 | Discharge: 2017-01-30 | Disposition: A | Discharge status: Home / Self Care | Payer: Medicare HMO | Source: Ambulatory Visit | Attending: Internal Medicine | Admitting: Internal Medicine

## 2017-01-30 DIAGNOSIS — M150 Primary generalized (osteo)arthritis: Secondary | ICD-10-CM

## 2017-01-30 DIAGNOSIS — M1611 Unilateral primary osteoarthritis, right hip: Secondary | ICD-10-CM | POA: Insufficient documentation

## 2017-01-30 DIAGNOSIS — M1612 Unilateral primary osteoarthritis, left hip: Secondary | ICD-10-CM | POA: Insufficient documentation

## 2017-03-07 ENCOUNTER — Emergency Department (HOSPITAL_BASED_OUTPATIENT_CLINIC_OR_DEPARTMENT_OTHER): Payer: Medicare HMO

## 2017-03-07 ENCOUNTER — Emergency Department (HOSPITAL_COMMUNITY): Payer: Medicare HMO

## 2017-03-07 ENCOUNTER — Encounter (HOSPITAL_BASED_OUTPATIENT_CLINIC_OR_DEPARTMENT_OTHER): Payer: Self-pay

## 2017-03-07 ENCOUNTER — Other Ambulatory Visit: Payer: Self-pay

## 2017-03-07 ENCOUNTER — Emergency Department (HOSPITAL_BASED_OUTPATIENT_CLINIC_OR_DEPARTMENT_OTHER)
Admission: EM | Admit: 2017-03-07 | Discharge: 2017-03-08 | Disposition: A | Discharge status: Home / Self Care | Payer: Medicare HMO | Attending: Emergency Medicine | Admitting: Emergency Medicine

## 2017-03-07 DIAGNOSIS — M542 Cervicalgia: Secondary | ICD-10-CM | POA: Insufficient documentation

## 2017-03-07 DIAGNOSIS — I10 Essential (primary) hypertension: Secondary | ICD-10-CM | POA: Insufficient documentation

## 2017-03-07 DIAGNOSIS — Z7982 Long term (current) use of aspirin: Secondary | ICD-10-CM | POA: Diagnosis not present

## 2017-03-07 DIAGNOSIS — Z8546 Personal history of malignant neoplasm of prostate: Secondary | ICD-10-CM | POA: Diagnosis not present

## 2017-03-07 DIAGNOSIS — Z79899 Other long term (current) drug therapy: Secondary | ICD-10-CM | POA: Insufficient documentation

## 2017-03-07 DIAGNOSIS — R262 Difficulty in walking, not elsewhere classified: Secondary | ICD-10-CM | POA: Diagnosis not present

## 2017-03-07 DIAGNOSIS — D649 Anemia, unspecified: Secondary | ICD-10-CM

## 2017-03-07 DIAGNOSIS — I2510 Atherosclerotic heart disease of native coronary artery without angina pectoris: Secondary | ICD-10-CM | POA: Diagnosis not present

## 2017-03-07 HISTORY — DX: Dorsalgia, unspecified: M54.9

## 2017-03-07 LAB — COMPREHENSIVE METABOLIC PANEL
ALT: 17 U/L (ref 17–63)
AST: 20 U/L (ref 15–41)
Albumin: 4.3 g/dL (ref 3.5–5.0)
Alkaline Phosphatase: 62 U/L (ref 38–126)
Anion gap: 8 (ref 5–15)
BILIRUBIN TOTAL: 0.6 mg/dL (ref 0.3–1.2)
BUN: 18 mg/dL (ref 6–20)
CHLORIDE: 103 mmol/L (ref 101–111)
CO2: 28 mmol/L (ref 22–32)
CREATININE: 0.98 mg/dL (ref 0.61–1.24)
Calcium: 9.6 mg/dL (ref 8.9–10.3)
Glucose, Bld: 105 mg/dL — ABNORMAL HIGH (ref 65–99)
Potassium: 3.9 mmol/L (ref 3.5–5.1)
Sodium: 139 mmol/L (ref 135–145)
TOTAL PROTEIN: 7.9 g/dL (ref 6.5–8.1)

## 2017-03-07 LAB — PROTIME-INR
INR: 1.15
Prothrombin Time: 14.6 seconds (ref 11.4–15.2)

## 2017-03-07 LAB — URINALYSIS, ROUTINE W REFLEX MICROSCOPIC
Bilirubin Urine: NEGATIVE
GLUCOSE, UA: NEGATIVE mg/dL
Hgb urine dipstick: NEGATIVE
KETONES UR: NEGATIVE mg/dL
LEUKOCYTES UA: NEGATIVE
NITRITE: NEGATIVE
PROTEIN: NEGATIVE mg/dL
Specific Gravity, Urine: 1.01 (ref 1.005–1.030)
pH: 6 (ref 5.0–8.0)

## 2017-03-07 LAB — RAPID URINE DRUG SCREEN, HOSP PERFORMED
Amphetamines: NOT DETECTED
BARBITURATES: NOT DETECTED
Benzodiazepines: POSITIVE — AB
Cocaine: NOT DETECTED
Opiates: NOT DETECTED
TETRAHYDROCANNABINOL: NOT DETECTED

## 2017-03-07 LAB — DIFFERENTIAL
BASOS PCT: 0 %
Basophils Absolute: 0 10*3/uL (ref 0.0–0.1)
EOS ABS: 0.1 10*3/uL (ref 0.0–0.7)
Eosinophils Relative: 1 %
LYMPHS ABS: 1.2 10*3/uL (ref 0.7–4.0)
Lymphocytes Relative: 24 %
MONO ABS: 0.5 10*3/uL (ref 0.1–1.0)
MONOS PCT: 11 %
NEUTROS ABS: 3.2 10*3/uL (ref 1.7–7.7)
Neutrophils Relative %: 64 %

## 2017-03-07 LAB — TROPONIN I

## 2017-03-07 LAB — CBC
HEMATOCRIT: 37.6 % — AB (ref 39.0–52.0)
HEMOGLOBIN: 12.4 g/dL — AB (ref 13.0–17.0)
MCH: 28.6 pg (ref 26.0–34.0)
MCHC: 33 g/dL (ref 30.0–36.0)
MCV: 86.6 fL (ref 78.0–100.0)
Platelets: 171 10*3/uL (ref 150–400)
RBC: 4.34 MIL/uL (ref 4.22–5.81)
RDW: 12.5 % (ref 11.5–15.5)
WBC: 4.9 10*3/uL (ref 4.0–10.5)

## 2017-03-07 LAB — ETHANOL: Alcohol, Ethyl (B): <10 mg/dL (ref <10)

## 2017-03-07 LAB — APTT: aPTT: 30 seconds (ref 24–36)

## 2017-03-07 NOTE — ED Triage Notes (Addendum)
Pt c/o pain to posterior neck that started after turning in bed yesterday "felt a pop"-pt also having pain to back of head, "whole back", bilat LEs and dizziness x today-pt states he was able to walk with own walker with difficulty-presents to triage in w/c-pt was able to stand and pivot with great difficulty from car to w/c to be brought into ED WR-son in law with pt

## 2017-03-07 NOTE — ED Provider Notes (Signed)
[facility] EMERGENCY DEPARTMENT Provider Note   CSN: [id number] Arrival date & time: 03/07/17  1540     History   Chief Complaint Chief Complaint  Patient presents with  . Neck Pain    HPI Husein Guedes is a 76 y.o. male.  HPI   76 year old male presenting with history of CAD, back pain, chronic degenerative disc disease, GERD, anxiety.  Patient is presenting today difficulty moving.  Patient is a really unclear historian.  He is here with son-in-law.  Apparently he came over Thanksgiving and they were having trouble getting him to move around, usually is able to use a walker.  Unclear when the change happened.  Patient also reports that he had some pain in the back of his neck.  Son-in-law reports this is chronic.  Patient very slow speaker, with a stuttering speech.  Difficult historian.  Past Medical History:  Diagnosis Date  . Anxiety   . Back pain   . Cancer ([place])   . Coronary artery disease   . Elevated cholesterol   . Hypertension   . Prostate cancer ([place])   . Reflux   . Sinusitis, chronic     There are no active problems to display for this patient.   Past Surgical History:  Procedure Laterality Date  . BACK SURGERY    . KNEE ARTHROSCOPY Left        Home Medications    Prior to Admission medications   Medication Sig Start Date End Date Taking? Authorizing Provider  ALPRAZolam Duanne Moron) 0.5 MG tablet Take 0.5 mg by mouth at bedtime as needed for sleep.    [provider]  amLODipine (NORVASC) 10 MG tablet Take 10 mg by mouth daily.    [provider]  aspirin 81 MG tablet Take 81 mg by mouth daily.    [provider]  bicalutamide (CASODEX) 50 MG tablet Take 50 mg by mouth daily.    [provider]  carvedilol (COREG) 25 MG tablet Take by mouth 2 (two) times daily with a meal.    [provider]  cloNIDine (CATAPRES) 0.3 MG tablet Take 0.3 mg by mouth 2 (two) times daily.    [provider]  cyclobenzaprine (FLEXERIL) 5 MG tablet Take 1 tablet (5 mg total) by mouth 2 (two) times daily as needed for muscle spasms. 05/18/14   Debby Freiberg, MD  docusate sodium (COLACE) 100 MG capsule Take 1 capsule (100 mg total) by mouth every 12 (twelve) hours. 09/30/15   Kirichenko, Lahoma Rocker, PA-C  ferrous gluconate (FERGON) 324 MG tablet Take 324 mg by mouth daily with breakfast.    [provider]  fexofenadine (ALLEGRA) 180 MG tablet Take 180 mg by mouth daily.    [provider]  finasteride (PROSCAR) 5 MG tablet Take 5 mg by mouth daily.    [provider]  folic acid (FOLVITE) 1 MG tablet Take by mouth daily.    [provider]  furosemide (LASIX) 40 MG tablet Take 40 mg by mouth daily.    [provider]  hydrALAZINE (APRESOLINE) 100 MG tablet Take 100 mg by mouth 2 (two) times daily.    [provider]  HYDROcodone-acetaminophen (NORCO) 5-325 MG per tablet Take 2 tablets by mouth every 4 (four) hours as needed for pain. 09/08/12   Veryl Speak, MD  HYDROcodone-acetaminophen (NORCO/VICODIN) 5-325 MG tablet Take 1 tablet by mouth every 4 (four) hours as needed. 09/30/15   Kirichenko, Lahoma Rocker, PA-C  isosorbide dinitrate (ISORDIL)  30 MG tablet Take 30 mg by mouth 4 (four) times daily.    [provider]  omeprazole (PRILOSEC) 20 MG capsule Take 20 mg by mouth daily.    [provider]  potassium chloride (K-DUR) 10 MEQ tablet Take 10 mEq by mouth 2 (two) times daily.    [provider]  predniSONE (DELTASONE) 10 MG tablet Take 2 tablets (20 mg total) by mouth 2 (two) times daily. 09/08/12   Veryl Speak, MD  rosuvastatin (CRESTOR) 10 MG tablet Take 10 mg by mouth daily.    [provider]  Vitamin D, Ergocalciferol, (DRISDOL) 50000 UNITS CAPS Take 50,000 Units by mouth.    [provider]    Family History No family history on file.  Social History Social History   Tobacco Use  . Smoking  status: Former Research scientist (life sciences)  . Smokeless tobacco: Never Used  Substance Use Topics  . Alcohol use: No  . Drug use: No     Allergies   Patient has no known allergies.   Review of Systems Review of Systems   Physical Exam Updated Vital Signs BP (!) 144/73   Pulse 64   Temp 97.7 F (36.5 C) (Oral)   Resp 19   Ht 5\' 11"  (1.803 m)   Wt 104.3 kg (230 lb)   SpO2 98%   BMI 32.08 kg/m   Physical Exam  Constitutional: He appears well-nourished.  HENT:  Head: Normocephalic.  Eyes: Conjunctivae are normal.  Neck:  No CT or L-spine tenderness.  Cardiovascular: Normal rate and regular rhythm.  No murmur heard. Pulmonary/Chest: Effort normal and breath sounds normal. No respiratory distress.  Neurological: Coordination abnormal.  Dysmetria on the right.  Not on the left.  Patient has stuttering speech, cranial nerves otherwise appear intact.  Able to move bilateral upper and lower extremities.  Left lower extremity weaker than right.  Patient unable to ambulate at all even with walker.  Skin: Skin is warm and dry. He is not diaphoretic.  Psychiatric: He has a normal mood and affect. His behavior is normal.     ED Treatments / Results  Labs (all labs ordered are listed, but only abnormal results are displayed) Labs Reviewed  CBC - Abnormal; Notable for the following components:      Result Value   Hemoglobin 12.4 (*)    HCT 37.6 (*)    All other components within normal limits  COMPREHENSIVE METABOLIC PANEL - Abnormal; Notable for the following components:   Glucose, Bld 105 (*)    All other components within normal limits  RAPID URINE DRUG SCREEN, HOSP PERFORMED - Abnormal; Notable for the following components:   Benzodiazepines POSITIVE (*)    All other components within normal limits  ETHANOL  PROTIME-INR  APTT  DIFFERENTIAL  TROPONIN I  URINALYSIS, ROUTINE W REFLEX MICROSCOPIC    EKG  EKG Interpretation  Date/Time:  Thursday March 07 2017 15:47:39  EST Ventricular Rate:  77 PR Interval:  194 QRS Duration: 78 QT Interval:  386 QTC Calculation: 436 R Axis:   22 Text Interpretation:  Sinus rhythm with occasional Premature ventricular complexes Cannot rule out Anterior infarct , age undetermined Abnormal ECG Normal sinus rhythm Confirmed by Zenovia Jarred [id number]) on 03/07/2017 4:54:58 PM Also confirmed by Zenovia Jarred [phone])  on 03/07/2017 5:33:25 PM       Radiology Dg Chest 2 View  Result Date: 03/07/2017 CLINICAL DATA:  Dizziness today. History of coronary artery disease and prostate carcinoma. EXAM: CHEST  2 VIEW COMPARISON:  10/15/2009 FINDINGS: Cardiac silhouette is borderline enlarged. No mediastinal or hilar masses. No evidence of adenopathy. Clear lungs.  No pleural effusion or pneumothorax. Skeletal structures are demineralized but intact. IMPRESSION: No active cardiopulmonary disease. Electronically Signed   By: Lajean Manes M.D.   On: 03/07/2017 17:19   Ct Head Wo Contrast  Result Date: 03/07/2017 CLINICAL DATA:  Pt c/o pain to posterior neck that started after turning in bed yesterday "felt a pop"-pt also having pain to back of head, "whole back", bilat LEs and dizziness x today-pt states he was able to walk with own walker with difficulty-presents to triage in w/c-pt was able to stand and pivot with great difficulty from car to w/c to be brought into ED WR-son in law with ptWeakness and dizziness today EXAM: CT HEAD WITHOUT CONTRAST TECHNIQUE: Contiguous axial images were obtained from the base of the skull through the vertex without intravenous contrast. COMPARISON:  09/08/2012 FINDINGS: Brain: No evidence of acute infarction, hemorrhage, hydrocephalus, extra-axial collection or mass lesion/mass effect. Vascular: No hyperdense vessel or unexpected calcification. Skull: Normal. Negative for fracture or focal lesion. Sinuses/Orbits: Globes and orbits are unremarkable. Visualized sinuses and mastoid air cells are clear.  Other: None. IMPRESSION: Normal unenhanced CT scan of the brain. Electronically Signed   By: Lajean Manes M.D.   On: 03/07/2017 17:18    Procedures Procedures (including critical care time)  Medications Ordered in ED Medications - No data to display   Initial Impression / Assessment and Plan / ED Course  I have reviewed the triage vital signs and the nursing notes.  Pertinent labs & imaging results that were available during my care of the patient were reviewed by me and considered in my medical decision making (see chart for details).        76 year old male presenting with history of CAD, back pain, chronic degenerative disc disease, GERD, anxiety.  Patient is presenting today difficulty moving.  Patient is a really unclear historian.  He is here with son-in-law.  Apparently he came over Thanksgiving and they were having trouble getting him to move around, usually is able to use a walker.  Unclear when the change happened.  Patient also reports that he had some pain in the back of his neck.  There is some son-in-law reports this is chronic.  Patient very slow speaker, with a stuttering speech.  Difficult historian.  4:56 PM Very concerned the patient may have had a stroke.  Will get initiate a CAT scan, lab work.  It is very unclear when this started.  I do not think patient would be a good candidate for TPA given the unclear nature of the start time of the symptoms.  6:53 PM Discussed patient's care with daughter as well on the phone.  Apparently he had some trouble moving around 1 week ago and then it improved over the last week and then got worse again today.  The course is very nebulous, so I think it would be safest to get an MRI to rule out ischemic cause.  Otherwise patient will follow-up with his primary care physician.  Patient's labs are reassuring, vital signs have been normal.  Patient has no cervical radiculopathy, or symptoms that make me think that we need an MRI of the  CT or L-spine.  Discussed with Dr. Tyrone Nine.  Will transfer to [facility] for MRI.  Final Clinical Impressions(s) / ED Diagnoses   Final diagnoses:  None    ED Discharge  Orders    None       Macarthur Critchley, MD 03/07/17 0518

## 2017-03-07 NOTE — Discharge Instructions (Signed)
Your blood tests, CT scan and MRI scan all look good. Please follow up with your primary care provider. Return if symptoms are getting worse.

## 2017-03-07 NOTE — ED Notes (Signed)
To MRI at this time.

## 2017-03-07 NOTE — ED Notes (Signed)
Patient returned from MRI.

## 2017-03-07 NOTE — ED Notes (Signed)
[facility] here to transport pt. 

## 2017-03-07 NOTE — ED Notes (Signed)
Patient transported to CT 

## 2017-03-07 NOTE — ED Notes (Signed)
Patient from [facility] for MRI of brain after having pain in neck.  He has had neck pain since waking with a pop in neck.

## 2017-03-07 NOTE — ED Provider Notes (Addendum)
76 year old male transferred here for MRI scan to rule out stroke.  Apparently, he has been having difficulty walking for about the last week.  MRI has come back showing no acute process.  Remainder of laboratory workup is significant for make very mild anemia.  Patient is advised of these findings and is discharged with instructions to follow-up with PCP.  Results for orders placed or performed during the hospital encounter of 03/07/17  Ethanol  Result Value Ref Range   Alcohol, Ethyl (B) <10 <10 mg/dL  Protime-INR  Result Value Ref Range   Prothrombin Time 14.6 11.4 - 15.2 seconds   INR 1.15   APTT  Result Value Ref Range   aPTT 30 24 - 36 seconds  CBC  Result Value Ref Range   WBC 4.9 4.0 - 10.5 K/uL   RBC 4.34 4.22 - 5.81 MIL/uL   Hemoglobin 12.4 (L) 13.0 - 17.0 g/dL   HCT 37.6 (L) 39.0 - 52.0 %   MCV 86.6 78.0 - 100.0 fL   MCH 28.6 26.0 - 34.0 pg   MCHC 33.0 30.0 - 36.0 g/dL   RDW 12.5 11.5 - 15.5 %   Platelets 171 150 - 400 K/uL  Differential  Result Value Ref Range   Neutrophils Relative % 64 %   Neutro Abs 3.2 1.7 - 7.7 K/uL   Lymphocytes Relative 24 %   Lymphs Abs 1.2 0.7 - 4.0 K/uL   Monocytes Relative 11 %   Monocytes Absolute 0.5 0.1 - 1.0 K/uL   Eosinophils Relative 1 %   Eosinophils Absolute 0.1 0.0 - 0.7 K/uL   Basophils Relative 0 %   Basophils Absolute 0.0 0.0 - 0.1 K/uL  Comprehensive metabolic panel  Result Value Ref Range   Sodium 139 135 - 145 mmol/L   Potassium 3.9 3.5 - 5.1 mmol/L   Chloride 103 101 - 111 mmol/L   CO2 28 22 - 32 mmol/L   Glucose, Bld 105 (H) 65 - 99 mg/dL   BUN 18 6 - 20 mg/dL   Creatinine, Ser 0.98 0.61 - 1.24 mg/dL   Calcium 9.6 8.9 - 10.3 mg/dL   Total Protein 7.9 6.5 - 8.1 g/dL   Albumin 4.3 3.5 - 5.0 g/dL   AST 20 15 - 41 U/L   ALT 17 17 - 63 U/L   Alkaline Phosphatase 62 38 - 126 U/L   Total Bilirubin 0.6 0.3 - 1.2 mg/dL   GFR calc non Af Amer >60 >60 mL/min   GFR calc Af Amer >60 >60 mL/min   Anion gap 8 5 - 15   Troponin I  Result Value Ref Range   Troponin I <0.03 <0.03 ng/mL  Urine rapid drug screen (hosp performed)  Result Value Ref Range   Opiates NONE DETECTED NONE DETECTED   Cocaine NONE DETECTED NONE DETECTED   Benzodiazepines POSITIVE (A) NONE DETECTED   Amphetamines NONE DETECTED NONE DETECTED   Tetrahydrocannabinol NONE DETECTED NONE DETECTED   Barbiturates NONE DETECTED NONE DETECTED  Urinalysis, Routine w reflex microscopic  Result Value Ref Range   Color, Urine YELLOW YELLOW   APPearance CLEAR CLEAR   Specific Gravity, Urine 1.010 1.005 - 1.030   pH 6.0 5.0 - 8.0   Glucose, UA NEGATIVE NEGATIVE mg/dL   Hgb urine dipstick NEGATIVE NEGATIVE   Bilirubin Urine NEGATIVE NEGATIVE   Ketones, ur NEGATIVE NEGATIVE mg/dL   Protein, ur NEGATIVE NEGATIVE mg/dL   Nitrite NEGATIVE NEGATIVE   Leukocytes, UA NEGATIVE NEGATIVE   Dg Chest  2 View  Result Date: 03/07/2017 CLINICAL DATA:  Dizziness today. History of coronary artery disease and prostate carcinoma. EXAM: CHEST  2 VIEW COMPARISON:  10/15/2009 FINDINGS: Cardiac silhouette is borderline enlarged. No mediastinal or hilar masses. No evidence of adenopathy. Clear lungs.  No pleural effusion or pneumothorax. Skeletal structures are demineralized but intact. IMPRESSION: No active cardiopulmonary disease. Electronically Signed   By: Lajean Manes M.D.   On: 03/07/2017 17:19   Ct Head Wo Contrast  Result Date: 03/07/2017 CLINICAL DATA:  Pt c/o pain to posterior neck that started after turning in bed yesterday "felt a pop"-pt also having pain to back of head, "whole back", bilat LEs and dizziness x today-pt states he was able to walk with own walker with difficulty-presents to triage in w/c-pt was able to stand and pivot with great difficulty from car to w/c to be brought into ED [facility]-son in law with ptWeakness and dizziness today EXAM: CT HEAD WITHOUT CONTRAST TECHNIQUE: Contiguous axial images were obtained from the base of the skull through  the vertex without intravenous contrast. COMPARISON:  09/08/2012 FINDINGS: Brain: No evidence of acute infarction, hemorrhage, hydrocephalus, extra-axial collection or mass lesion/mass effect. Vascular: No hyperdense vessel or unexpected calcification. Skull: Normal. Negative for fracture or focal lesion. Sinuses/Orbits: Globes and orbits are unremarkable. Visualized sinuses and mastoid air cells are clear. Other: None. IMPRESSION: Normal unenhanced CT scan of the brain. Electronically Signed   By: Lajean Manes M.D.   On: 03/07/2017 17:18   Mr Brain Wo Contrast  Result Date: 03/07/2017 CLINICAL DATA:  Ataxia EXAM: MRI HEAD WITHOUT CONTRAST TECHNIQUE: Multiplanar, multiecho pulse sequences of the brain and surrounding structures were obtained without intravenous contrast. COMPARISON:  Head CT 03/07/2017 FINDINGS: Brain: Normal diffusion-weighted imaging without acute ischemia. The midline structures are normal. The parenchymal signal of the brain is normal for age. There is no acute or chronic hemorrhage. No age advanced or lobar predominant atrophy. No mass or extra-axial collection. No dural abnormality. No hydrocephalus. Vascular: Unchanged appearance of diminutive left vertebral artery. Otherwise, the major intracranial arterial flow voids are preserved. Skull and upper cervical spine: Normal marrow signal of the skull and upper cervical spine. No focal abnormality of the skullbase. Visible extracranial soft tissues are normal. Sinuses/Orbits: The paranasal sinuses are clear and there is no mastoid or middle ear effusion. Normal orbits. Other: None IMPRESSION: Normal aging brain. Electronically Signed   By: Ulyses Jarred M.D.   On: 03/07/2017 23:08   Images viewed by me.    Delora Fuel, MD 18/29/93 [id number]    Delora Fuel, MD 67/89/38 [id number]

## 2019-06-29 IMAGING — DX DG HIP (WITH OR WITHOUT PELVIS) 2V BILAT
5 series · 5 of 5 positions shown · non-contrast
Comparison: CT scan of May 18, 2014.

CLINICAL DATA: Severe left hip pain without known injury.

EXAM:
DG HIP (WITH OR WITHOUT PELVIS) 2V BILAT

[pelvis ap]
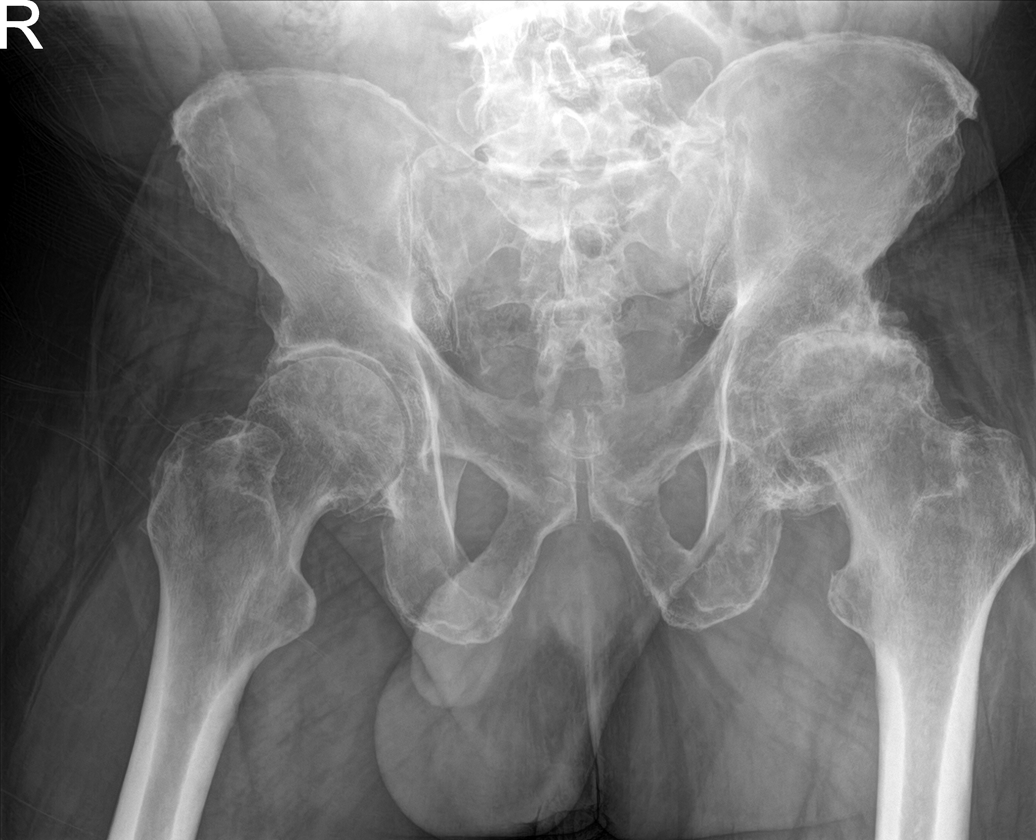

[hip ap (1 of 2)]
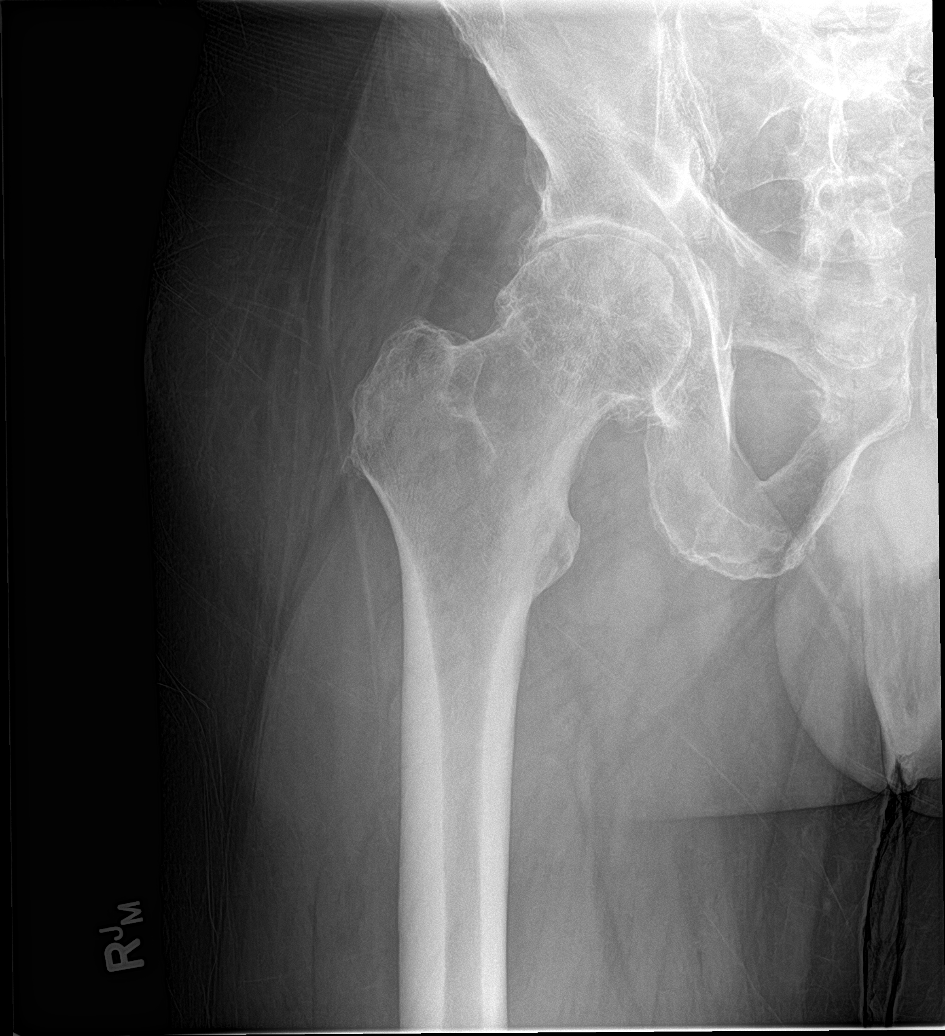

[hip frog leg (1 of 2)]
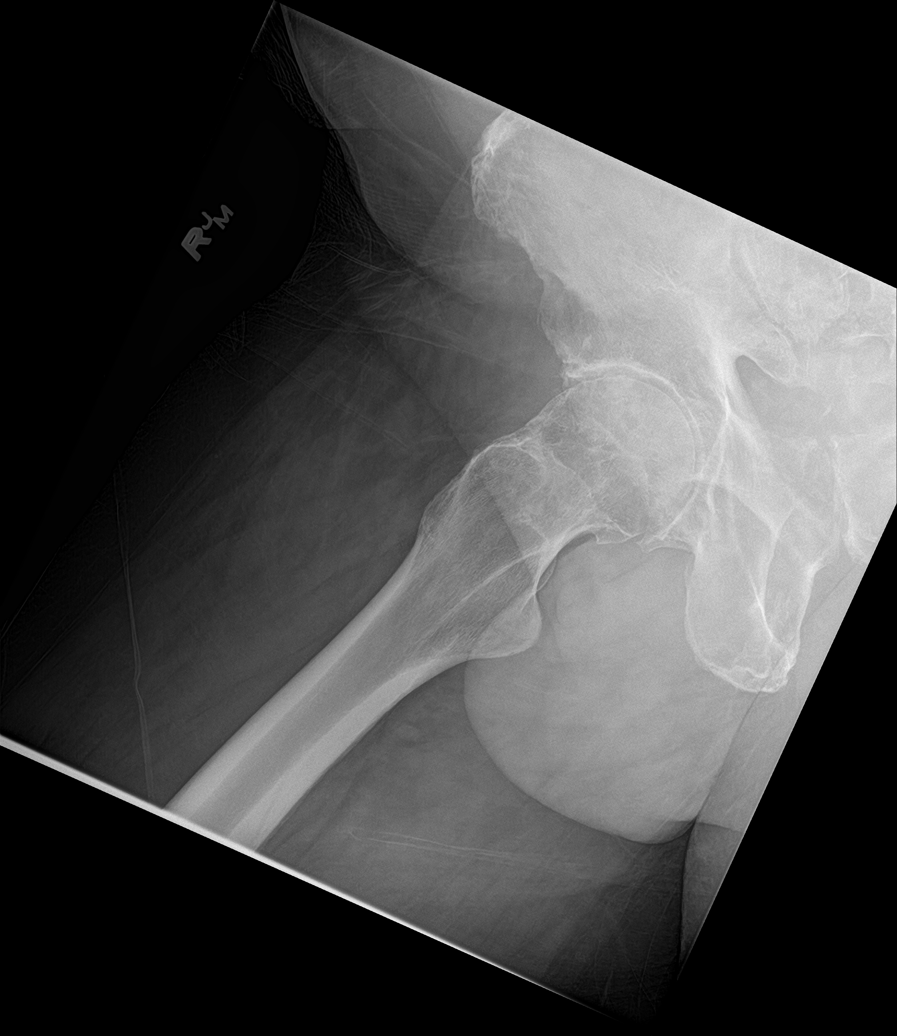

[hip ap (2 of 2)]
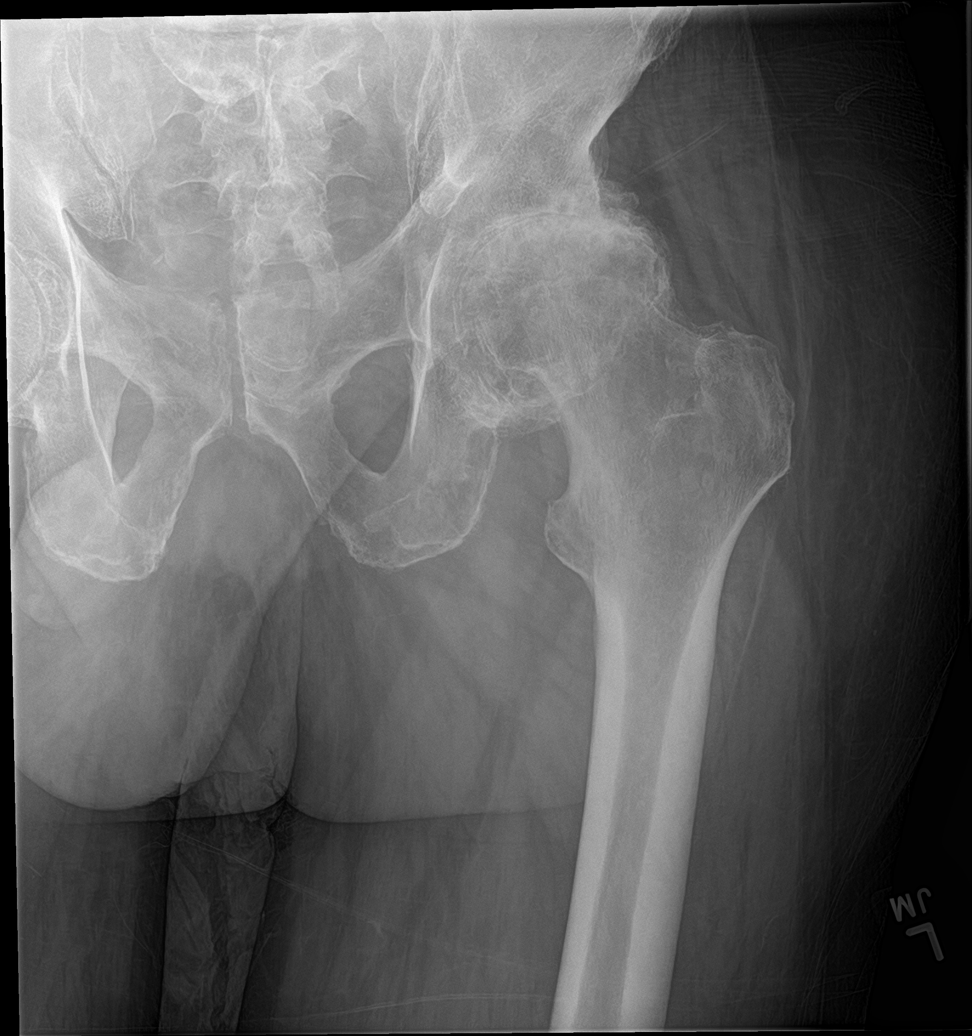

[hip frog leg (2 of 2)]
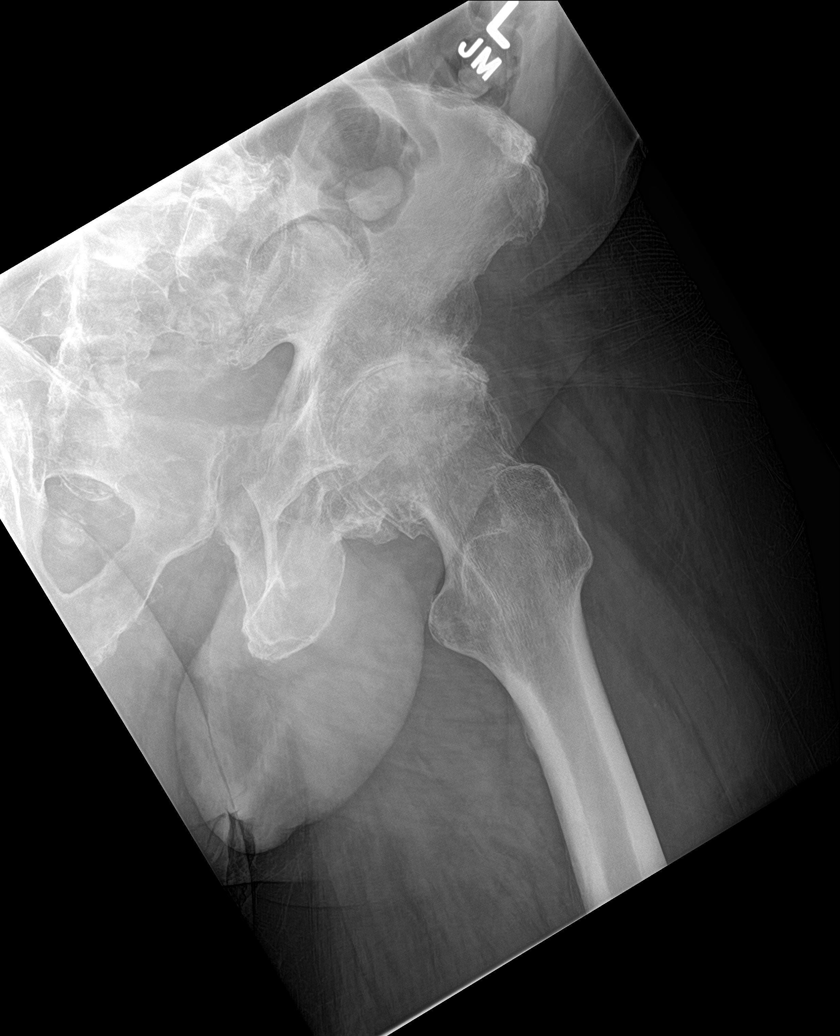

[5 of 5 positions shown; findings below may reference images not displayed]

FINDINGS: Mild joint space narrowing of right hip is noted with osteophyte
formation. Severe narrowing of left hip joint is noted with
osteophyte formation. No fracture or dislocation is noted.
Sacroiliac joints are unremarkable.
IMPRESSION: Severe degenerative joint disease of the left hip. Mild degenerative
joint disease of the right hip. No acute abnormality is noted in
either hip.

## 2019-08-15 DEATH — deceased
